# Patient Record
Sex: Female | Born: 1980 | Race: White | Hispanic: No | Marital: Married
Health system: Southern US, Community
[De-identification: ages and names within clinical notes are randomized; demographics above are authoritative.]

---

## 2006-02-27 ENCOUNTER — Encounter: Payer: Self-pay | Admitting: Maternal & Fetal Medicine

## 2006-06-26 ENCOUNTER — Observation Stay: Payer: Self-pay | Admitting: Obstetrics and Gynecology

## 2006-07-07 ENCOUNTER — Inpatient Hospital Stay: Payer: Self-pay

## 2007-11-23 IMAGING — US US OB DETAIL+14 WK - NRPT [facility]
1 series · 13 of 28 positions shown · non-contrast
Comparison: none

REASON FOR EXAM: evaluate fetal heart
COMMENTS:

[Series 1: us ob detail+14 wk - nrpt [facility] · 0.37mm/px · 13 of 71 slices shown]
[im 3/71]
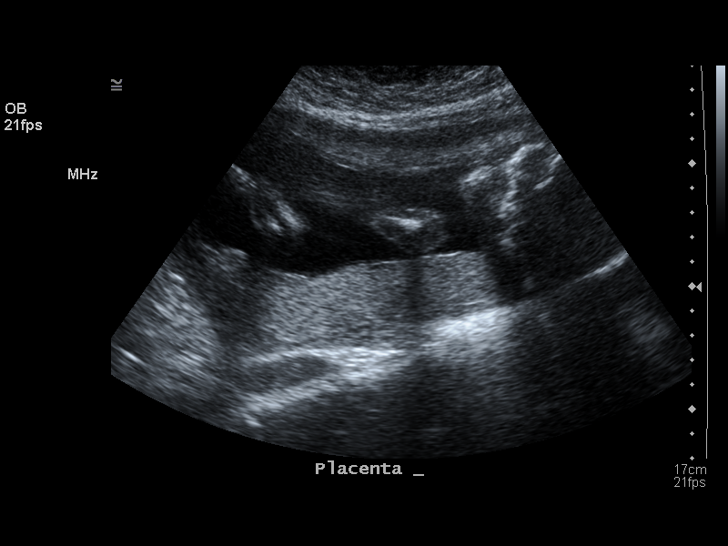
[im 8/71]
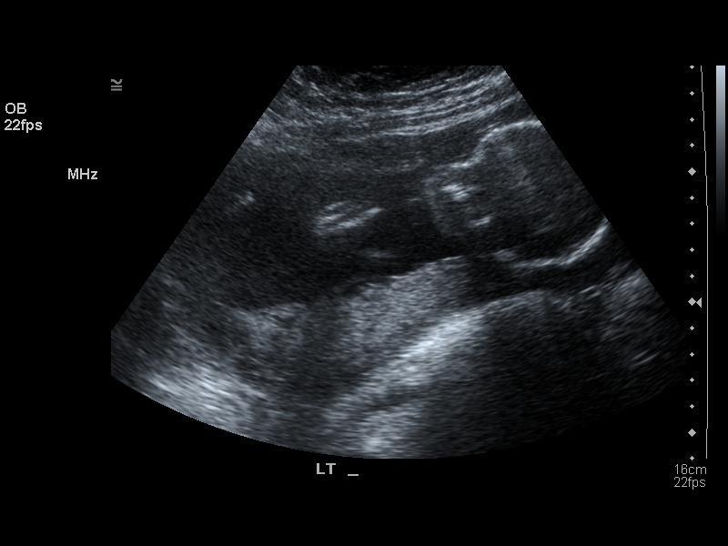
[im 13/71]
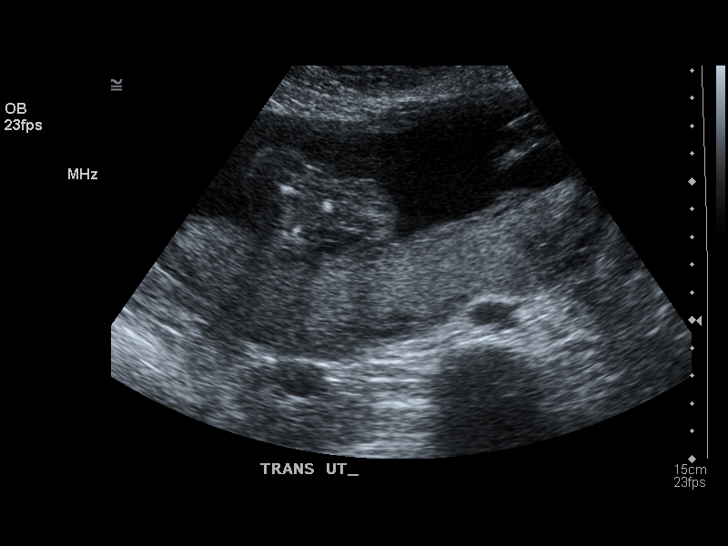
[im 19/71]
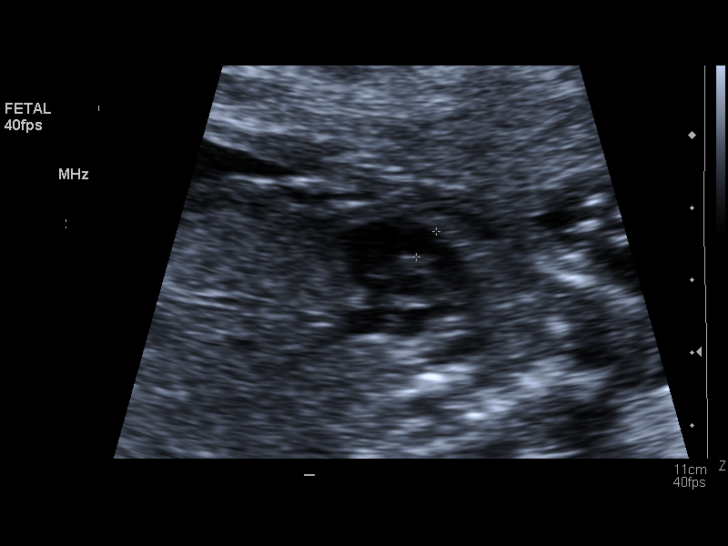
[im 24/71]
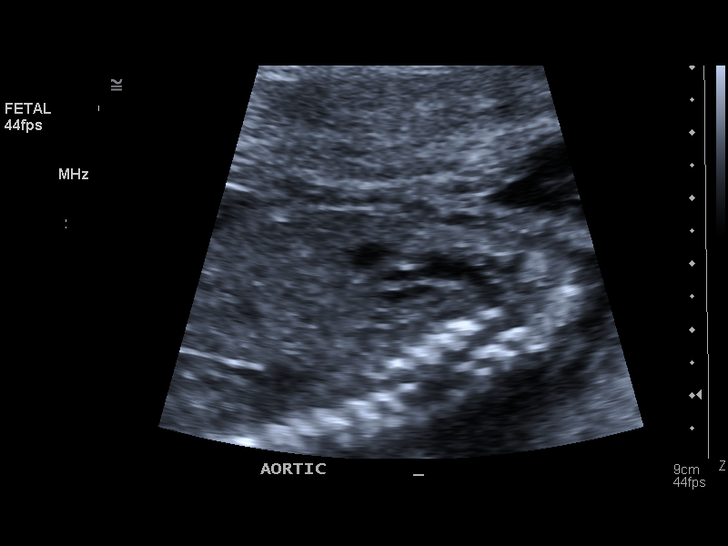
[im 29/71]
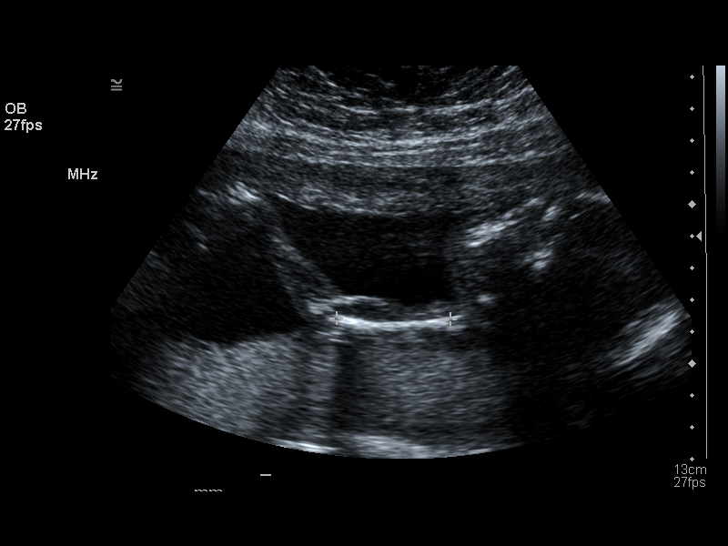
[im 37/71]
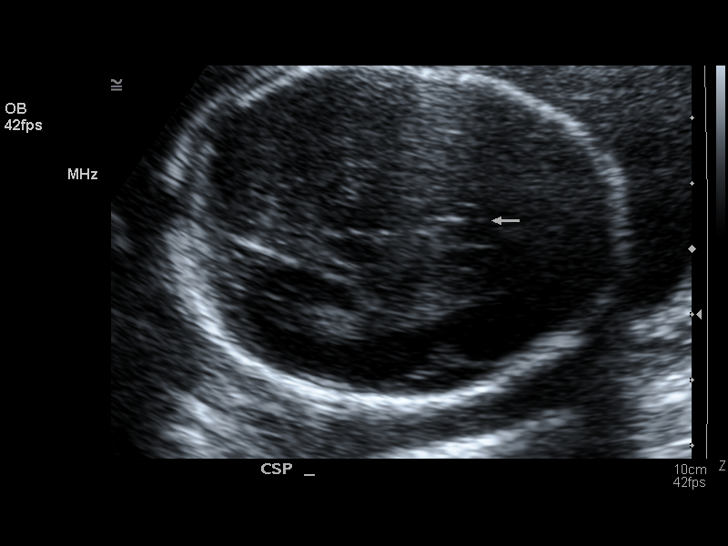
[im 42/71]
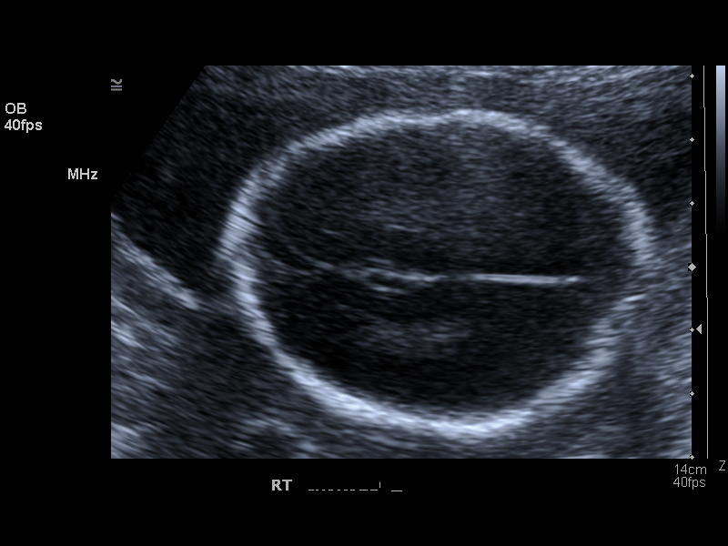
[im 47/71]
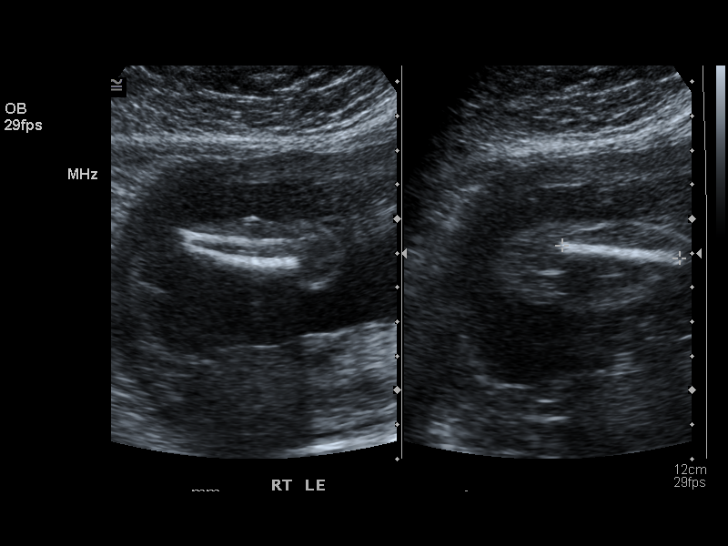
[im 52/71]
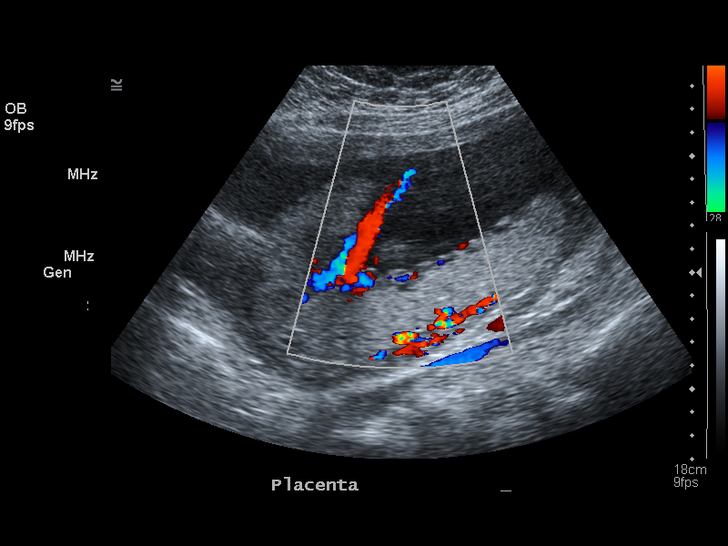
[im 58/71]
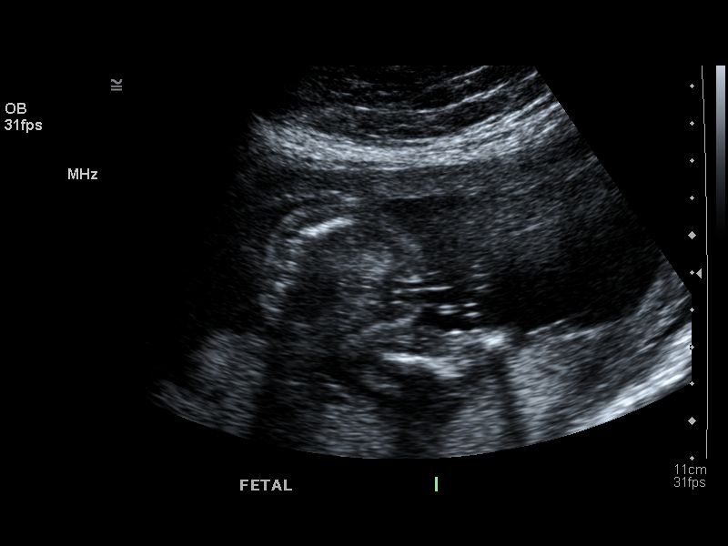
[im 63/71]
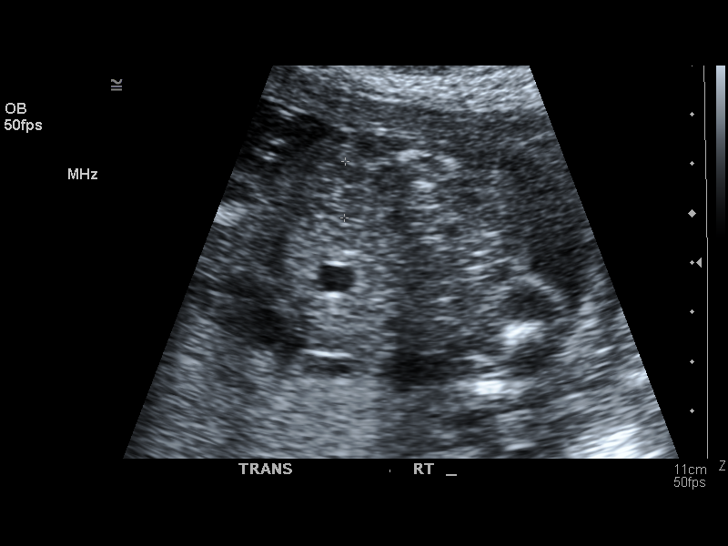
[im 68/71]
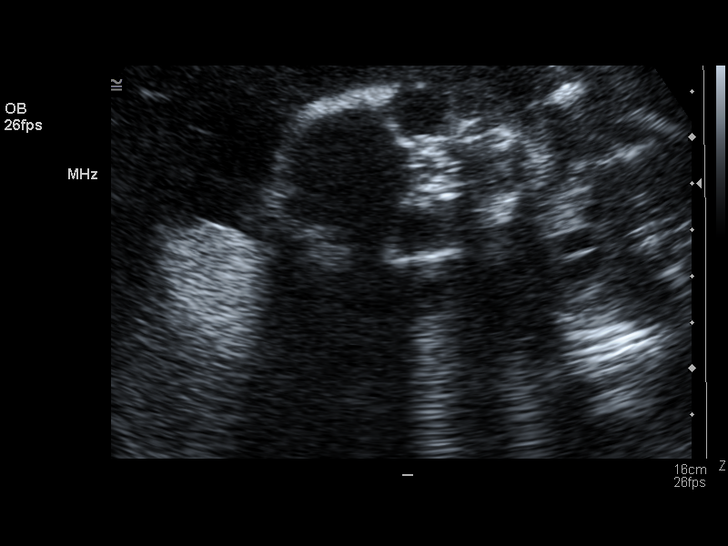

[13 of 28 positions shown; findings below may reference images not displayed]

PROCEDURE:     PER - OB US DETAILED SINGLE FETUS  - February 27, 2006 [DATE]

RESULT:     Normal level II ultrasound with normal views of the heart.

Dating:
Last menstrual period September 30, 2005 consistent with 21 weeks 3 days and EDC
July 07, 2006.
Current ultrasound age of 21 weeks 3 days with ultrasound EDC July 07, 2006.
Best overall assessment on February 27, 2006 of 21 weeks 3 days with EDC July 07, 2006.

General evaluation:
Fetal heart activity: Present.  Fetal heart rate 153 beats per minute.
Presentation: Variable.
Amniotic fluid: Normal.
Cord: 3 vessels.
Placenta: Posterior. No previa.

Biometry:
Biparietal diameter 52.1 mm consistent with 21 weeks 6 days.
Head circumference 188.5 mm consistent with 21 weeks one day.
Abdominal circumference 161.5 mm consistent with 21 weeks 2 days.
Femur length 34.2 mm consistent with 20 weeks 5 days.
Humerus length 35.4 mm consistent with 22 weeks one day.
Estimated fetal weight 396 grams or 14 ounces

Fetal anatomy:
Head: Cranium intact with normal shape.
Brain: Intracranial structures including cerebellum, cisterna magna, lateral
ventricles, choroid plexus and cavum septum pellucidum appear normal.
Face: Nose and upper lip appear normal.
Spine: Appears normal in sagittal and transverse views.
Neck: Nuchal fold measures 3.3 mm and is normal.
Heart: Four-chamber view, right ventricular outflow tract, left ventricular
outflow tract, ductus arteriosus and aortic arch were seen and appear
normal.
Abdominal wall: Cord insertion appears normal.
Gastrointestinal tract: Stomach is on the left appears normal.
Kidneys: Left and right kidney appear normal.
Bladder: Appears normal.
Genitalia: Male.
Extremities: Upper and lower extremities including hands and feet appear
normal.

Genetic sonogram:
Nuchal fold normal.
Pyelectasis absent.
Hyperechogenic bowel absent.
Echogenic intracardiac focus absent.

Internal structures:
Cervix measures 3.85 cm.
IMPRESSION: Intrauterine pregnancy at 21 weeks 3 days.
Normal level II fetal anatomy.
Normal views of the heart.
No markers of aneuploidy identified.
Measurements consistent with stated EDC.

Recommendation:
Followup as clinically indicated.

Thank you for the opportunity to participate in her care.

## 2014-03-07 ENCOUNTER — Observation Stay: Payer: Self-pay | Admitting: Obstetrics and Gynecology

## 2014-03-10 ENCOUNTER — Inpatient Hospital Stay: Payer: Self-pay | Admitting: Obstetrics and Gynecology

## 2014-03-10 LAB — CBC WITH DIFFERENTIAL/PLATELET
BASOS ABS: 0 10*3/uL (ref 0.0–0.1)
Basophil %: 0.5 %
EOS ABS: 0.1 10*3/uL (ref 0.0–0.7)
Eosinophil %: 0.5 %
HCT: 37.8 % (ref 35.0–47.0)
HGB: 12.6 g/dL (ref 12.0–16.0)
LYMPHS ABS: 1.6 10*3/uL (ref 1.0–3.6)
Lymphocyte %: 15.8 %
MCH: 29.7 pg (ref 26.0–34.0)
MCHC: 33.4 g/dL (ref 32.0–36.0)
MCV: 89 fL (ref 80–100)
MONO ABS: 0.5 x10 3/mm (ref 0.2–0.9)
Monocyte %: 4.8 %
Neutrophil #: 8 10*3/uL — ABNORMAL HIGH (ref 1.4–6.5)
Neutrophil %: 78.4 %
PLATELETS: 189 10*3/uL (ref 150–440)
RBC: 4.25 10*6/uL (ref 3.80–5.20)
RDW: 14.5 % (ref 11.5–14.5)
WBC: 10.2 10*3/uL (ref 3.6–11.0)

## 2014-03-11 LAB — HEMATOCRIT: HCT: 34.6 % — AB (ref 35.0–47.0)

## 2014-07-22 NOTE — H&P (Signed)
L&D Evaluation:  History:  HPI Pt is a 34 yo G4P3003 at 39.[redacted] weeks GA with an EDC of 03/14/14 presents to L&D with reports of contractions that started last night and have continued to get more painful along with some bloody show. She reports +FM, denies lof. Prenatal course significant for fetal renal pelvis dilation, and a history of macrosomia. She is A+, VI, RI, GBS negative, and UTD on Tdap and Flu.   Presents with contractions, vaginal bleeding   Patient's Medical History other  kidney stones   Patient's Surgical History Colecystectomy  lithotripsy   Medications Pre Natal Vitamins   Allergies NKDA   Social History none   Family History Non-Contributory   ROS:  ROS All systems were reviewed.  HEENT, CNS, GI, GU, Respiratory, CV, Renal and Musculoskeletal systems were found to be normal.   Exam:  Vital Signs stable   General no apparent distress   Mental Status clear   Abdomen gravid, tender with contractions   Pelvic 5/90/-1 upon admission   Mebranes Intact   FHT normal rate with no decels, 130-135, moderate variability, +accels, no decels   Ucx regular, every 3-5   Skin dry, no lesions, no rashes   Lymph no lymphadenopathy   Impression:  Impression active labor, reactive NST, IUP at 39.3   Plan:  Plan EFM/NST, pt plans epidural for pain control, anticipate svd   Follow Up Appointment need to schedule   Electronic Signatures: Jannet MantisSubudhi, Ladaja Yusupov (CNM)  (Signed 28-Dec-15 09:46)  Authored: L&D Evaluation   Last Updated: 28-Dec-15 09:46 by Jannet MantisSubudhi, Kalob Bergen (CNM)

## 2016-08-12 ENCOUNTER — Ambulatory Visit: Payer: Self-pay | Admitting: Advanced Practice Midwife

## 2016-09-08 ENCOUNTER — Ambulatory Visit: Payer: Self-pay | Admitting: Advanced Practice Midwife

## 2023-09-07 LAB — PANORAMA PRENATAL TEST FULL PANEL:PANORAMA TEST PLUS 5 ADDITIONAL MICRODELETIONS
FETAL FRACTION: 2.4
REPORT SUMMARY: INCREASED — AB
TRIPLOIDY 13 18 RESULT TEXT: INCREASED — AB
